# Patient Record
Sex: Female | Born: 1950
Health system: Southern US, Community
[De-identification: ages and names within clinical notes are randomized; demographics above are authoritative.]

---

## 2007-10-05 ENCOUNTER — Emergency Department: Payer: Self-pay | Admitting: Internal Medicine

## 2018-09-12 DIAGNOSIS — H5203 Hypermetropia, bilateral: Secondary | ICD-10-CM | POA: Diagnosis not present

## 2018-09-12 DIAGNOSIS — H524 Presbyopia: Secondary | ICD-10-CM | POA: Diagnosis not present

## 2018-11-07 DIAGNOSIS — H2513 Age-related nuclear cataract, bilateral: Secondary | ICD-10-CM | POA: Diagnosis not present

## 2018-11-13 DIAGNOSIS — I10 Essential (primary) hypertension: Secondary | ICD-10-CM | POA: Diagnosis not present

## 2018-11-13 DIAGNOSIS — R011 Cardiac murmur, unspecified: Secondary | ICD-10-CM | POA: Diagnosis not present

## 2018-11-13 DIAGNOSIS — R9431 Abnormal electrocardiogram [ECG] [EKG]: Secondary | ICD-10-CM | POA: Diagnosis not present

## 2018-11-13 DIAGNOSIS — H269 Unspecified cataract: Secondary | ICD-10-CM | POA: Diagnosis not present

## 2018-11-13 DIAGNOSIS — I208 Other forms of angina pectoris: Secondary | ICD-10-CM | POA: Diagnosis not present

## 2018-11-21 DIAGNOSIS — I10 Essential (primary) hypertension: Secondary | ICD-10-CM | POA: Diagnosis not present

## 2018-11-21 DIAGNOSIS — R9431 Abnormal electrocardiogram [ECG] [EKG]: Secondary | ICD-10-CM | POA: Diagnosis not present

## 2018-11-21 DIAGNOSIS — I208 Other forms of angina pectoris: Secondary | ICD-10-CM | POA: Diagnosis not present

## 2019-04-25 DIAGNOSIS — H2513 Age-related nuclear cataract, bilateral: Secondary | ICD-10-CM | POA: Diagnosis not present

## 2019-05-17 DIAGNOSIS — H2511 Age-related nuclear cataract, right eye: Secondary | ICD-10-CM | POA: Diagnosis not present

## 2019-06-10 DIAGNOSIS — H2512 Age-related nuclear cataract, left eye: Secondary | ICD-10-CM | POA: Diagnosis not present

## 2019-06-14 DIAGNOSIS — H25812 Combined forms of age-related cataract, left eye: Secondary | ICD-10-CM | POA: Diagnosis not present

## 2019-06-14 DIAGNOSIS — H268 Other specified cataract: Secondary | ICD-10-CM | POA: Diagnosis not present

## 2019-07-08 ENCOUNTER — Emergency Department
Admission: EM | Admit: 2019-07-08 | Discharge: 2019-07-08 | Disposition: A | Discharge status: Home / Self Care | Payer: Medicare HMO | Attending: Emergency Medicine | Admitting: Emergency Medicine

## 2019-07-08 ENCOUNTER — Other Ambulatory Visit: Payer: Self-pay

## 2019-07-08 ENCOUNTER — Emergency Department: Payer: Medicare HMO

## 2019-07-08 DIAGNOSIS — I10 Essential (primary) hypertension: Secondary | ICD-10-CM

## 2019-07-08 DIAGNOSIS — R42 Dizziness and giddiness: Secondary | ICD-10-CM

## 2019-07-08 LAB — URINALYSIS, COMPLETE (UACMP) WITH MICROSCOPIC
Bilirubin Urine: NEGATIVE
Glucose, UA: NEGATIVE mg/dL
Hgb urine dipstick: NEGATIVE
Ketones, ur: NEGATIVE mg/dL
Leukocytes,Ua: NEGATIVE
Nitrite: POSITIVE — AB
Protein, ur: NEGATIVE mg/dL
Specific Gravity, Urine: 1.006 (ref 1.005–1.030)
pH: 5 (ref 5.0–8.0)

## 2019-07-08 LAB — BASIC METABOLIC PANEL
Anion gap: 11 (ref 5–15)
BUN: 10 mg/dL (ref 8–23)
CO2: 22 mmol/L (ref 22–32)
Calcium: 9.7 mg/dL (ref 8.9–10.3)
Chloride: 107 mmol/L (ref 98–111)
Creatinine, Ser: 0.67 mg/dL (ref 0.44–1.00)
GFR calc Af Amer: >60 mL/min (ref >60)
GFR calc non Af Amer: >60 mL/min (ref >60)
Glucose, Bld: 103 mg/dL — ABNORMAL HIGH (ref 70–99)
Potassium: 3.4 mmol/L — ABNORMAL LOW (ref 3.5–5.1)
Sodium: 140 mmol/L (ref 135–145)

## 2019-07-08 LAB — CBC
HCT: 38.5 % (ref 36.0–46.0)
Hemoglobin: 12.8 g/dL (ref 12.0–15.0)
MCH: 29.7 pg (ref 26.0–34.0)
MCHC: 33.2 g/dL (ref 30.0–36.0)
MCV: 89.3 fL (ref 80.0–100.0)
Platelets: 311 10*3/uL (ref 150–400)
RBC: 4.31 MIL/uL (ref 3.87–5.11)
RDW: 12.9 % (ref 11.5–15.5)
WBC: 8.2 10*3/uL (ref 4.0–10.5)
nRBC: 0 % (ref 0.0–0.2)

## 2019-07-08 LAB — TROPONIN I (HIGH SENSITIVITY)
Troponin I (High Sensitivity): 9 ng/L (ref <18)
Troponin I (High Sensitivity): 14 ng/L (ref <18)

## 2019-07-08 MED ORDER — LOSARTAN POTASSIUM 50 MG PO TABS: 50.0000 mg | ORAL_TABLET | Freq: Every day | ORAL | 11 refills | Status: AC

## 2019-07-08 MED ORDER — HYDROCHLOROTHIAZIDE 12.5 MG PO TABS: 12.5000 mg | ORAL_TABLET | Freq: Every day | ORAL | 11 refills | Status: AC

## 2019-07-08 NOTE — ED Notes (Signed)
Ambulated to BR independently.  Gait steady.  Tolerated well. 

## 2019-07-08 NOTE — ED Notes (Signed)
Informed Mac RN pt placed in [facility]

## 2019-07-08 NOTE — ED Notes (Signed)
First Nurse Note: Pt to ED via POV c/o left side weakness x 2 weeks, has gotten progressively worse. Pt states that yesterday she felt "uncomfortable". Pt reports feeling dizzy. Dizziness has gotten worse as the morning has went on. Pt states that she went to bed around 4 pm yesterday, woke up at 0125 pt states that the dizziness had gotten progressively worse since then. No slurred speech noted. Pt was able to ambulate into ED without difficulty.

## 2019-07-08 NOTE — ED Triage Notes (Addendum)
Pt reports dizziness intermittently throughout the week, states this am at 0125 she woke up with severe dizziness and discomfort in her back and left shoulder and side. Pt reports that she drove herself here, states that she feels like she is leaning to the right side, pt appears to be sitting straight. Pt has seen dr Clayborn Bigness but doesn't recall the visit and is unsure is she was supposed to be on blood pressure meds, and reports that she didn't follow up. Pt's facial movements are symmetrical, no deviation of tongue with protrusion, pt has equal hand grips

## 2019-07-08 NOTE — ED Provider Notes (Signed)
[facility] Emergency Department Provider Note       Time seen: ----------------------------------------- 3:32 PM on 07/08/2019 -----------------------------------------   I have reviewed the triage vital signs and the nursing notes.  HISTORY   Chief Complaint Dizziness and Hypertension   HPI Beth Cruz is a 68 y.o. female with no known past medical history who presents to the ED for dizziness intermittently throughout the week.  Patient states this morning she woke up with severe dizziness and discomfort.  She drove herself here, felt like she was having head spinning.  She describes a visit earlier this year where she was prescribed blood pressure medication but has not been taking it.  No past medical history on file.  There are no active problems to display for this patient.  Allergies Penicillins  Social History Social History   Tobacco Use  . Smoking status: Not on file  Substance Use Topics  . Alcohol use: Not on file  . Drug use: Not on file   Review of Systems Constitutional: Negative for fever. Cardiovascular: Negative for chest pain. Respiratory: Negative for shortness of breath. Gastrointestinal: Negative for abdominal pain, vomiting and diarrhea. Musculoskeletal: Negative for back pain. Skin: Negative for rash. Neurological: Negative for headaches, focal weakness or numbness.  Positive for dizziness  All systems negative/normal/unremarkable except as stated in the HPI  ____________________________________________   PHYSICAL EXAM:  VITAL SIGNS: ED Triage Vitals  Enc Vitals Group     BP 07/08/19 1147 (!) 237/74     Pulse Rate 07/08/19 1147 (!) 58     Resp 07/08/19 1147 16     Temp 07/08/19 1147 99 F (37.2 C)     Temp Source 07/08/19 1147 Oral     SpO2 07/08/19 1147 100 %     Weight 07/08/19 1202 145 lb (65.8 kg)     Height 07/08/19 1202 5\' 6"  (1.676 m)     Head Circumference --      Peak Flow --      Pain  Score 07/08/19 1201 9     Pain Loc --      Pain Edu? --      Excl. in GC? --     Constitutional: Alert and oriented. Well appearing and in no distress. Eyes: Conjunctivae are normal. Normal extraocular movements. ENT      Head: Normocephalic and atraumatic.      Nose: No congestion/rhinnorhea.      Mouth/Throat: Mucous membranes are moist.      Neck: No stridor. Cardiovascular: Normal rate, regular rhythm. No murmurs, rubs, or gallops. Respiratory: Normal respiratory effort without tachypnea nor retractions. Breath sounds are clear and equal bilaterally. No wheezes/rales/rhonchi. Gastrointestinal: Soft and nontender. Normal bowel sounds Musculoskeletal: Nontender with normal range of motion in extremities. No lower extremity tenderness nor edema. Neurologic:  Normal speech and language. No gross focal neurologic deficits are appreciated.  Skin:  Skin is warm, dry and intact. No rash noted. Psychiatric: Mood and affect are normal. Speech and behavior are normal.  ____________________________________________  EKG: Interpreted by me.  Sinus bradycardia with a rate of 49 bpm, T wave abnormalities in the inferior and anterior lateral leads.  Normal QT  ____________________________________________  ED COURSE:  As part of my medical decision making, I reviewed the following data within the electronic MEDICAL RECORD NUMBER History obtained from family if available, nursing notes, old chart and ekg, as well as notes from prior ED visits. Patient presented for dizziness, we will assess with labs and  imaging as indicated at this time.   Procedures  Beth Cruz was evaluated in Emergency Department on 07/08/2019 for the symptoms described in the history of present illness. She was evaluated in the context of the global COVID-19 pandemic, which necessitated consideration that the patient might be at risk for infection with the SARS-CoV-2 virus that causes COVID-19. Institutional protocols and  algorithms that pertain to the evaluation of patients at risk for COVID-19 are in a state of rapid change based on information released by regulatory bodies including the CDC and federal and state organizations. These policies and algorithms were followed during the patient's care in the ED.  ____________________________________________   LABS (pertinent positives/negatives)  Labs Reviewed  BASIC METABOLIC PANEL - Abnormal; Notable for the following components:      Result Value   Potassium 3.4 (*)    Glucose, Bld 103 (*)    All other components within normal limits  URINALYSIS, COMPLETE (UACMP) WITH MICROSCOPIC - Abnormal; Notable for the following components:   Color, Urine STRAW (*)    APPearance CLEAR (*)    Nitrite POSITIVE (*)    Bacteria, UA MANY (*)    All other components within normal limits  URINE CULTURE  CBC  CBG MONITORING, ED  TROPONIN I (HIGH SENSITIVITY)  TROPONIN I (HIGH SENSITIVITY)    RADIOLOGY Images were viewed by me  CT head Is unremarkable ____________________________________________   DIFFERENTIAL DIAGNOSIS   Dizziness, dehydration, electrolyte abnormality, anxiety, vertigo  FINAL ASSESSMENT AND PLAN  Dizziness, hypertension   Plan: The patient had presented for dizziness. Patient's labs did reveal possible UTI for which we sent a urine culture. Patient's imaging was unremarkable.  Repeat troponin testing was negative.  I did review her charts from earlier this year and she was supposed to be taking Cozaar, hydrochlorothiazide and amlodipine.  She is somewhat bradycardic which will need to be rechecked.  I will hold amlodipine and just restart Cozaar and hydrochlorothiazide.   Laurence Aly, MD    Note: This note was generated in part or whole with voice recognition software. Voice recognition is usually quite accurate but there are transcription errors that can and very often do occur. I apologize for any typographical errors that were  not detected and corrected.     Earleen Newport, MD 07/08/19 [phone]

## 2019-07-09 LAB — URINE CULTURE

## 2019-07-12 DIAGNOSIS — I10 Essential (primary) hypertension: Secondary | ICD-10-CM | POA: Diagnosis not present

## 2019-07-12 DIAGNOSIS — R9431 Abnormal electrocardiogram [ECG] [EKG]: Secondary | ICD-10-CM | POA: Diagnosis not present

## 2019-07-12 DIAGNOSIS — R011 Cardiac murmur, unspecified: Secondary | ICD-10-CM | POA: Diagnosis not present

## 2019-07-12 DIAGNOSIS — H269 Unspecified cataract: Secondary | ICD-10-CM | POA: Diagnosis not present

## 2019-07-12 DIAGNOSIS — I208 Other forms of angina pectoris: Secondary | ICD-10-CM | POA: Diagnosis not present

## 2019-08-23 DIAGNOSIS — H524 Presbyopia: Secondary | ICD-10-CM | POA: Diagnosis not present

## 2019-08-23 DIAGNOSIS — H52209 Unspecified astigmatism, unspecified eye: Secondary | ICD-10-CM | POA: Diagnosis not present

## 2019-08-23 DIAGNOSIS — H5213 Myopia, bilateral: Secondary | ICD-10-CM | POA: Diagnosis not present

## 2019-08-23 DIAGNOSIS — Z20828 Contact with and (suspected) exposure to other viral communicable diseases: Secondary | ICD-10-CM | POA: Diagnosis not present

## 2019-11-08 ENCOUNTER — Ambulatory Visit: Payer: Medicare HMO | Attending: Internal Medicine

## 2019-11-08 DIAGNOSIS — Z23 Encounter for immunization: Secondary | ICD-10-CM

## 2019-11-08 NOTE — Progress Notes (Signed)
   Covid-19 Vaccination Clinic  Name:  Beth Cruz    MRN: [id number] DOB: 1950/09/26  11/08/2019  Beth Cruz was observed post Covid-19 immunization for 15 minutes based on pre-vaccination questions without incidence. She was provided with Vaccine Information Sheet and instruction to access the V-Safe system.   Beth Cruz was instructed to call 911 with any severe reactions post vaccine: Marland Kitchen Difficulty breathing  . Swelling of your face and throat  . A fast heartbeat  . A bad rash all over your body  . Dizziness and weakness    Immunizations Administered    Name Date Dose VIS Date Route   Pfizer COVID-19 Vaccine 11/08/2019 11:16 AM 0.3 mL 08/24/2019 Intramuscular   Manufacturer: [employer], [employer]   Lot: [id number]   NDC: [address]-1

## 2019-11-28 ENCOUNTER — Ambulatory Visit: Payer: Medicare HMO | Attending: Internal Medicine

## 2019-11-28 DIAGNOSIS — Z23 Encounter for immunization: Secondary | ICD-10-CM

## 2019-11-28 NOTE — Progress Notes (Signed)
   Covid-19 Vaccination Clinic  Name:  Beth Cruz    MRN: [id number] DOB: 01/26/1951  11/28/2019  [name] was observed post Covid-19 immunization for 15 minutes without incident. She was provided with Vaccine Information Sheet and instruction to access the V-Safe system.   [name] was instructed to call 911 with any severe reactions post vaccine: Marland Kitchen Difficulty breathing  . Swelling of face and throat  . A fast heartbeat  . A bad rash all over body  . Dizziness and weakness   Immunizations Administered    Name Date Dose VIS Date Route   Pfizer COVID-19 Vaccine 11/28/2019 12:51 PM 0.3 mL 08/24/2019 Intramuscular   Manufacturer: [employer], [employer]   Lot: [id number]   NDC: [address]-5

## 2020-01-28 DIAGNOSIS — Z0100 Encounter for examination of eyes and vision without abnormal findings: Secondary | ICD-10-CM | POA: Diagnosis not present

## 2020-07-10 DIAGNOSIS — I10 Essential (primary) hypertension: Secondary | ICD-10-CM | POA: Diagnosis not present

## 2020-07-10 DIAGNOSIS — I208 Other forms of angina pectoris: Secondary | ICD-10-CM | POA: Diagnosis not present

## 2020-07-10 DIAGNOSIS — R42 Dizziness and giddiness: Secondary | ICD-10-CM | POA: Diagnosis not present

## 2020-07-10 DIAGNOSIS — Z720 Tobacco use: Secondary | ICD-10-CM | POA: Diagnosis not present

## 2020-07-10 DIAGNOSIS — R0600 Dyspnea, unspecified: Secondary | ICD-10-CM | POA: Diagnosis not present

## 2020-07-10 DIAGNOSIS — R011 Cardiac murmur, unspecified: Secondary | ICD-10-CM | POA: Diagnosis not present

## 2020-08-18 DIAGNOSIS — Z880 Allergy status to penicillin: Secondary | ICD-10-CM | POA: Diagnosis not present

## 2020-08-18 DIAGNOSIS — I10 Essential (primary) hypertension: Secondary | ICD-10-CM | POA: Diagnosis not present

## 2020-08-18 DIAGNOSIS — Z87891 Personal history of nicotine dependence: Secondary | ICD-10-CM | POA: Diagnosis not present

## 2020-08-18 DIAGNOSIS — Z809 Family history of malignant neoplasm, unspecified: Secondary | ICD-10-CM | POA: Diagnosis not present

## 2020-08-21 DIAGNOSIS — R69 Illness, unspecified: Secondary | ICD-10-CM | POA: Diagnosis not present

## 2020-08-29 DIAGNOSIS — R69 Illness, unspecified: Secondary | ICD-10-CM | POA: Diagnosis not present

## 2020-10-29 IMAGING — CT CT HEAD W/O CM
3 series · 15 of 47 positions shown, 18 images · non-contrast
Comparison: None.

CLINICAL DATA: Vertigo

EXAM:
CT HEAD WITHOUT CONTRAST
TECHNIQUE: Contiguous axial images were obtained from the base of the skull
through the vertex without intravenous contrast.

[Series 2: head wo · axial · 0.47mm/px · z∈[-133,-8]mm · 9 of 30 slices shown, 12 images]
[im 3/30  brain]
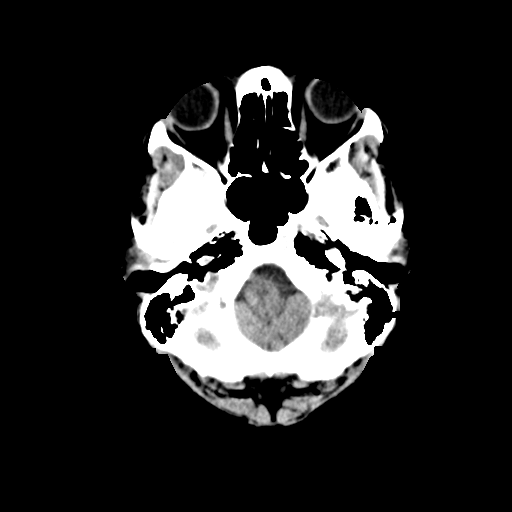
[im 3/30  bone]
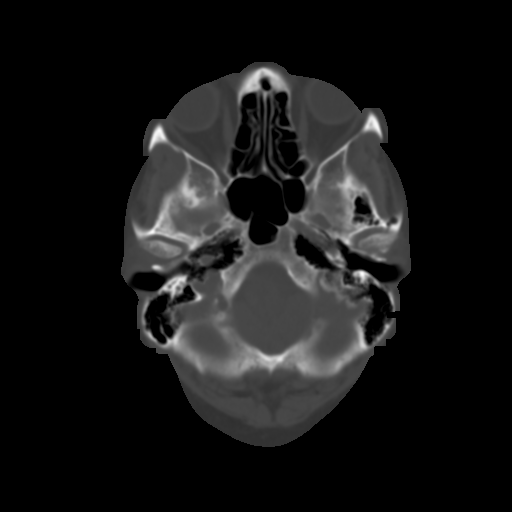
[im 6/30  brain]
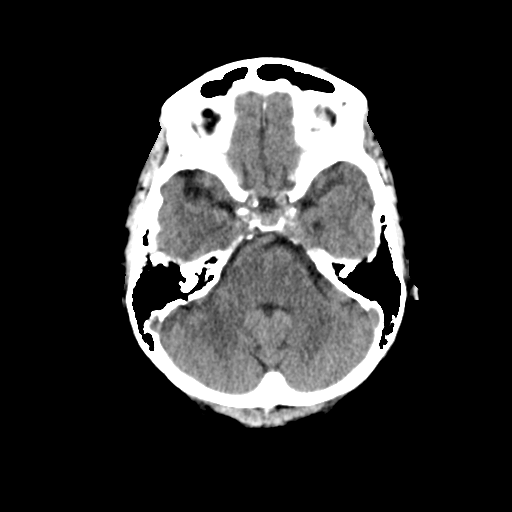
[im 9/30  brain]
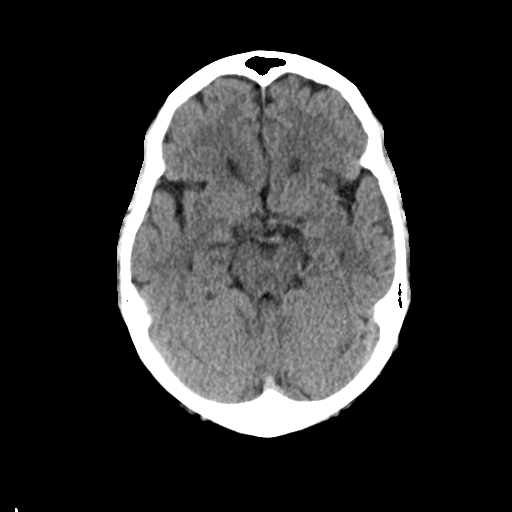
[im 12/30  brain]
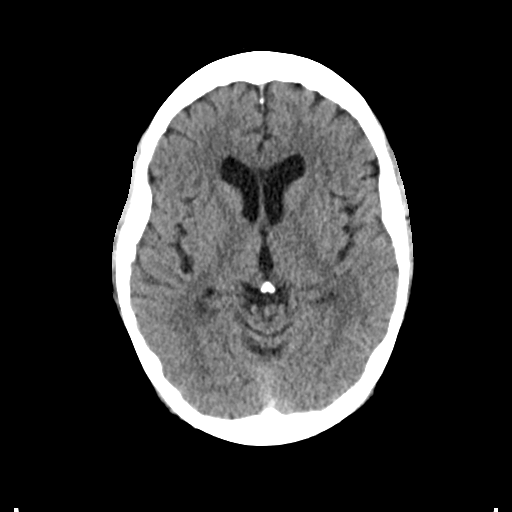
[im 16/30  brain]
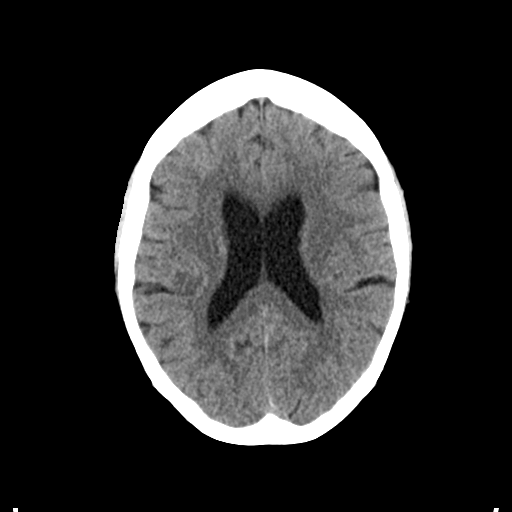
[im 16/30  bone]
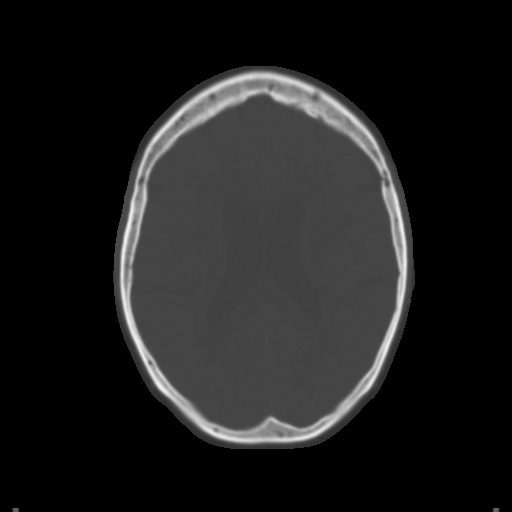
[im 19/30  brain]
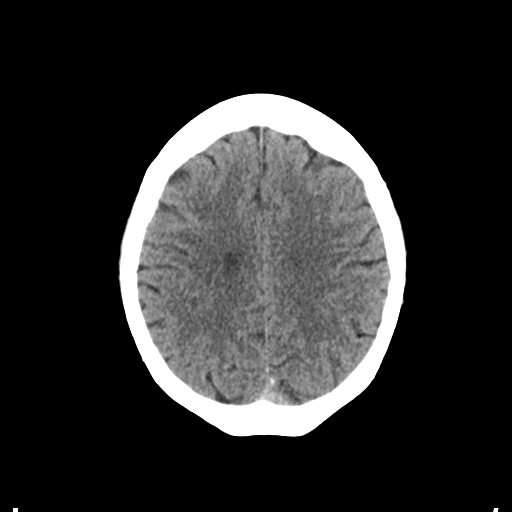
[im 22/30  brain]
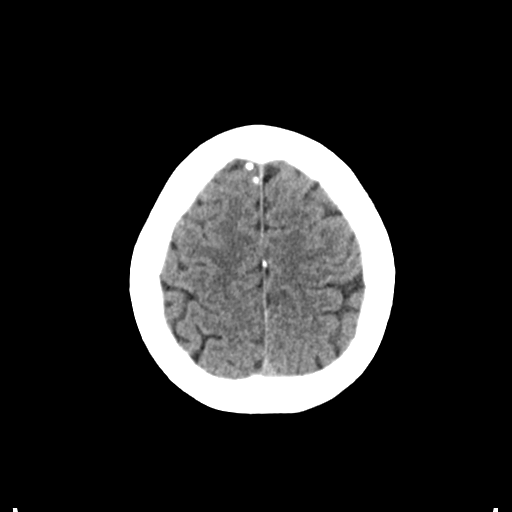
[im 25/30  brain]
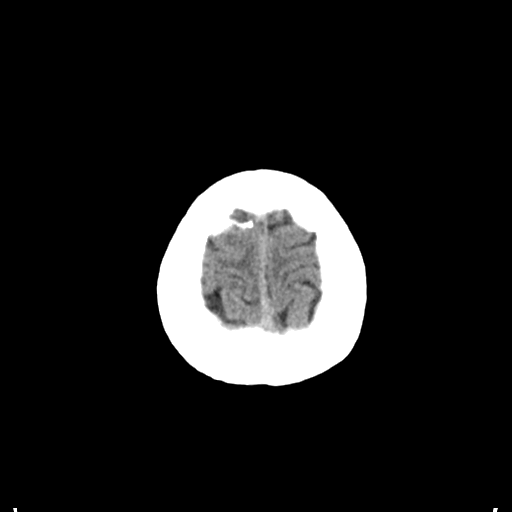
[im 28/30  brain]
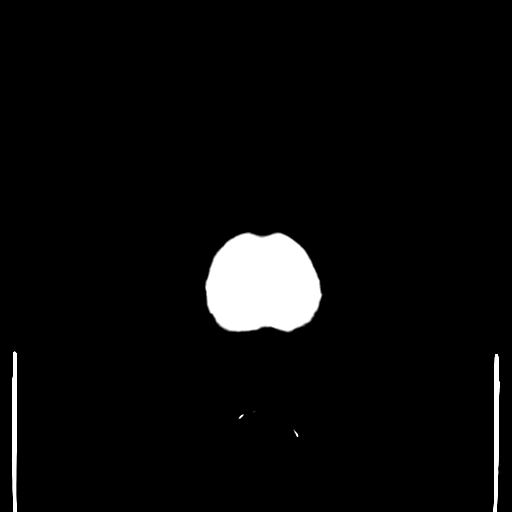
[im 28/30  bone]
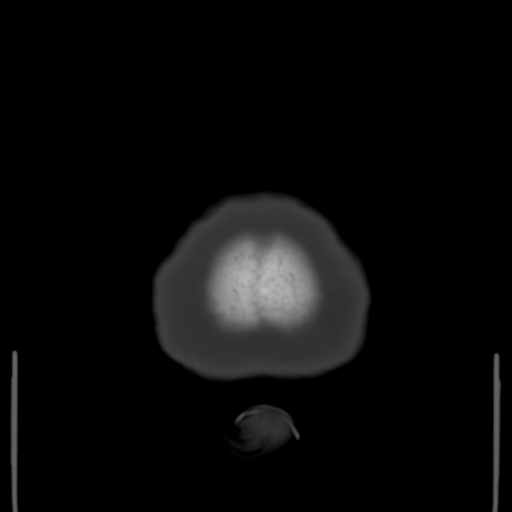

[Series 4: coronal soft tissue · coronal · 0.28mm/px · 3 of 66 slices shown]
[im 22/66  brain]
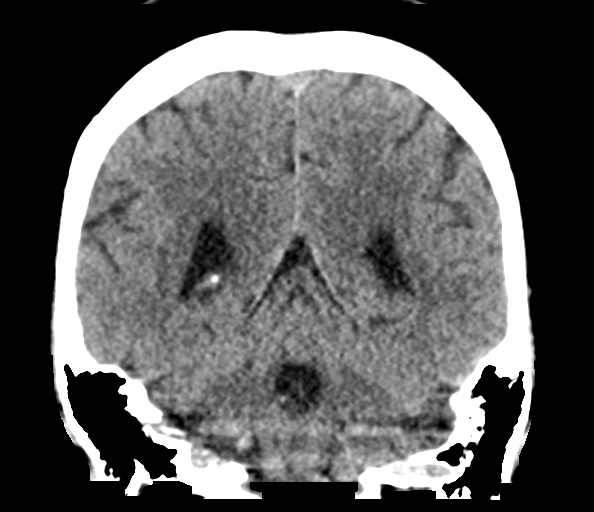
[im 29/66  brain]
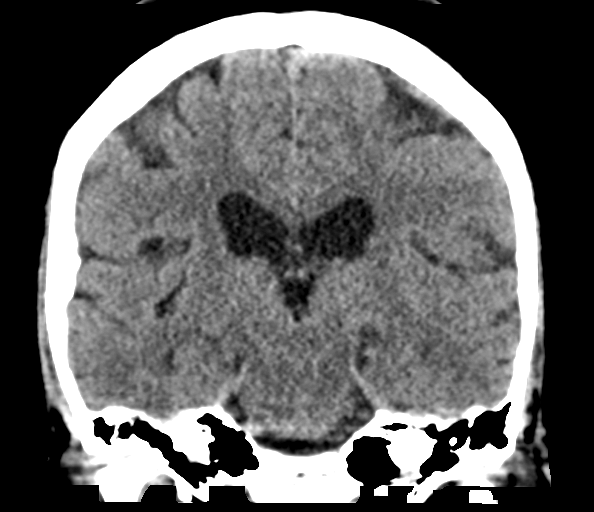
[im 37/66  brain]
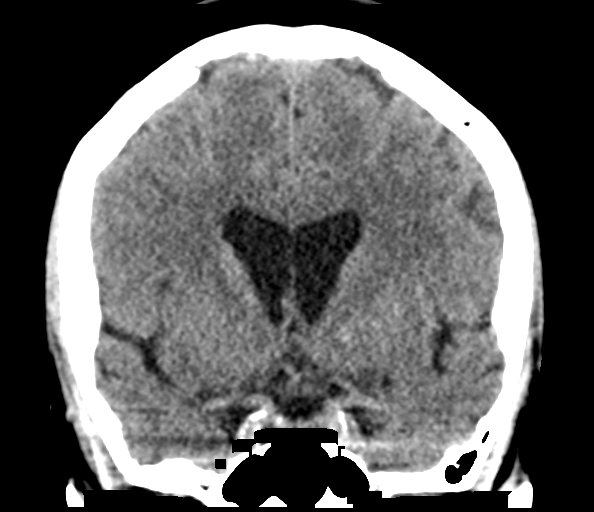

[Series 5: sagittal soft tissue · sagittal · 0.28mm/px · 3 of 56 slices shown]
[im 19/56  brain]
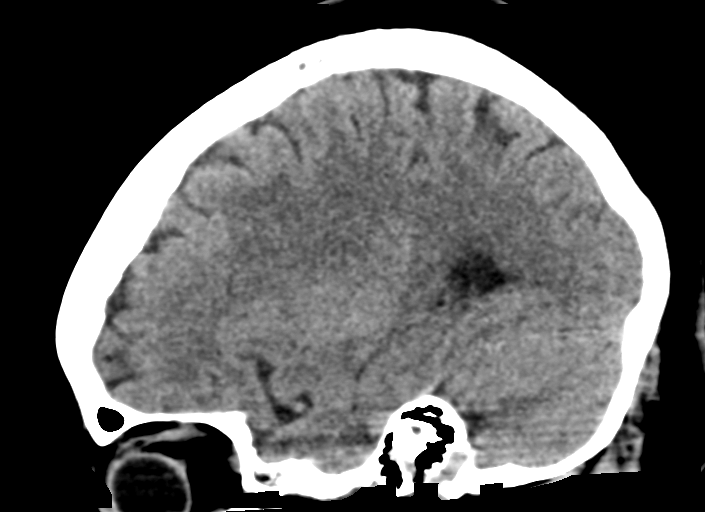
[im 28/56  brain]
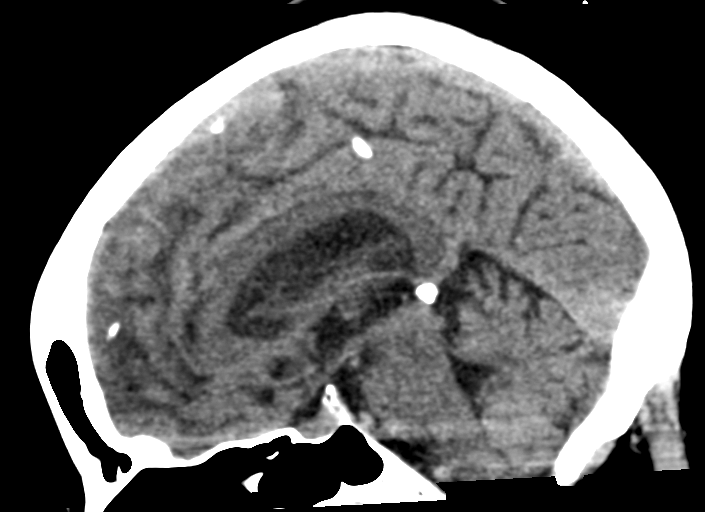
[im 37/56  brain]
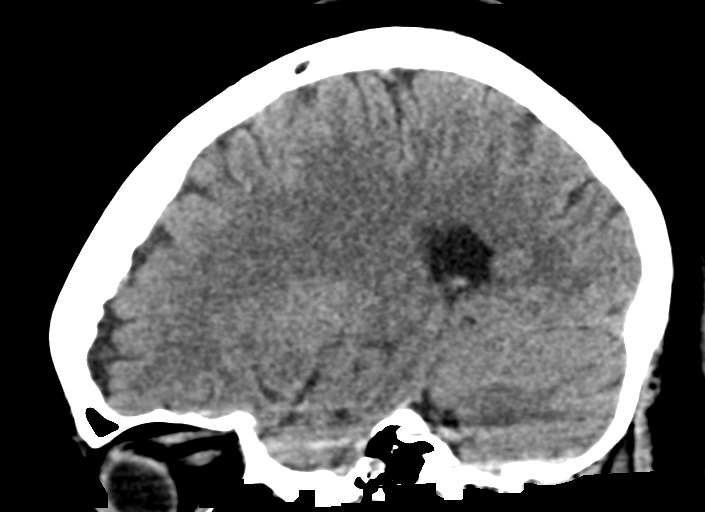

[15 of 47 positions shown; findings below may reference images not displayed]

FINDINGS: Brain: No evidence of acute infarction, hemorrhage, hydrocephalus,
extra-axial collection or mass lesion/mass effect.

Vascular: There are vascular calcifications in the carotid siphons.

Skull: Normal. Negative for fracture or focal lesion.

Sinuses/Orbits: No acute finding.

Other: None.
IMPRESSION: No acute intracranial process.

## 2021-01-07 DIAGNOSIS — Z7689 Persons encountering health services in other specified circumstances: Secondary | ICD-10-CM | POA: Diagnosis not present

## 2021-01-07 DIAGNOSIS — I208 Other forms of angina pectoris: Secondary | ICD-10-CM | POA: Diagnosis not present

## 2021-01-07 DIAGNOSIS — R0600 Dyspnea, unspecified: Secondary | ICD-10-CM | POA: Diagnosis not present

## 2021-01-07 DIAGNOSIS — Z79899 Other long term (current) drug therapy: Secondary | ICD-10-CM | POA: Diagnosis not present

## 2021-01-07 DIAGNOSIS — R69 Illness, unspecified: Secondary | ICD-10-CM | POA: Diagnosis not present

## 2021-01-07 DIAGNOSIS — R9431 Abnormal electrocardiogram [ECG] [EKG]: Secondary | ICD-10-CM | POA: Diagnosis not present

## 2021-01-07 DIAGNOSIS — I10 Essential (primary) hypertension: Secondary | ICD-10-CM | POA: Diagnosis not present

## 2021-01-07 DIAGNOSIS — R42 Dizziness and giddiness: Secondary | ICD-10-CM | POA: Diagnosis not present

## 2021-01-07 DIAGNOSIS — R011 Cardiac murmur, unspecified: Secondary | ICD-10-CM | POA: Diagnosis not present

## 2021-01-20 DIAGNOSIS — R0600 Dyspnea, unspecified: Secondary | ICD-10-CM | POA: Diagnosis not present

## 2021-01-20 DIAGNOSIS — I208 Other forms of angina pectoris: Secondary | ICD-10-CM | POA: Diagnosis not present

## 2021-02-03 DIAGNOSIS — M25472 Effusion, left ankle: Secondary | ICD-10-CM | POA: Diagnosis not present

## 2021-02-03 DIAGNOSIS — R9431 Abnormal electrocardiogram [ECG] [EKG]: Secondary | ICD-10-CM | POA: Diagnosis not present

## 2021-02-03 DIAGNOSIS — M25471 Effusion, right ankle: Secondary | ICD-10-CM | POA: Diagnosis not present

## 2021-02-03 DIAGNOSIS — I208 Other forms of angina pectoris: Secondary | ICD-10-CM | POA: Diagnosis not present

## 2021-02-03 DIAGNOSIS — R42 Dizziness and giddiness: Secondary | ICD-10-CM | POA: Diagnosis not present

## 2021-02-03 DIAGNOSIS — R011 Cardiac murmur, unspecified: Secondary | ICD-10-CM | POA: Diagnosis not present

## 2021-02-03 DIAGNOSIS — R0600 Dyspnea, unspecified: Secondary | ICD-10-CM | POA: Diagnosis not present

## 2021-02-03 DIAGNOSIS — I10 Essential (primary) hypertension: Secondary | ICD-10-CM | POA: Diagnosis not present

## 2021-02-03 DIAGNOSIS — Z720 Tobacco use: Secondary | ICD-10-CM | POA: Diagnosis not present

## 2021-03-27 ENCOUNTER — Other Ambulatory Visit: Payer: Self-pay | Admitting: Infectious Diseases

## 2021-03-27 DIAGNOSIS — Z1211 Encounter for screening for malignant neoplasm of colon: Secondary | ICD-10-CM | POA: Diagnosis not present

## 2021-03-27 DIAGNOSIS — Z1212 Encounter for screening for malignant neoplasm of rectum: Secondary | ICD-10-CM | POA: Diagnosis not present

## 2021-03-27 DIAGNOSIS — R69 Illness, unspecified: Secondary | ICD-10-CM | POA: Diagnosis not present

## 2021-03-27 DIAGNOSIS — Z0000 Encounter for general adult medical examination without abnormal findings: Secondary | ICD-10-CM | POA: Diagnosis not present

## 2021-03-27 DIAGNOSIS — Z1231 Encounter for screening mammogram for malignant neoplasm of breast: Secondary | ICD-10-CM

## 2021-03-27 DIAGNOSIS — I10 Essential (primary) hypertension: Secondary | ICD-10-CM | POA: Diagnosis not present

## 2021-04-07 DIAGNOSIS — Z1211 Encounter for screening for malignant neoplasm of colon: Secondary | ICD-10-CM | POA: Diagnosis not present

## 2021-04-07 DIAGNOSIS — Z1212 Encounter for screening for malignant neoplasm of rectum: Secondary | ICD-10-CM | POA: Diagnosis not present

## 2021-04-13 LAB — COLOGUARD: COLOGUARD: NEGATIVE

## 2021-05-04 DIAGNOSIS — R69 Illness, unspecified: Secondary | ICD-10-CM | POA: Diagnosis not present

## 2021-06-02 ENCOUNTER — Ambulatory Visit
Admission: RE | Admit: 2021-06-02 | Discharge: 2021-06-02 | Disposition: A | Discharge status: Home / Self Care | Payer: Medicare HMO | Source: Ambulatory Visit | Attending: Infectious Diseases | Admitting: Infectious Diseases

## 2021-06-02 ENCOUNTER — Other Ambulatory Visit: Payer: Self-pay

## 2021-06-02 DIAGNOSIS — Z1231 Encounter for screening mammogram for malignant neoplasm of breast: Secondary | ICD-10-CM | POA: Insufficient documentation

## 2021-06-03 ENCOUNTER — Other Ambulatory Visit: Payer: Self-pay | Admitting: *Deleted

## 2021-06-03 ENCOUNTER — Inpatient Hospital Stay
Admission: RE | Admit: 2021-06-03 | Discharge: 2021-06-03 | Disposition: A | Discharge status: Home / Self Care | Payer: Self-pay | Source: Ambulatory Visit | Attending: *Deleted | Admitting: *Deleted

## 2021-06-03 DIAGNOSIS — Z1231 Encounter for screening mammogram for malignant neoplasm of breast: Secondary | ICD-10-CM

## 2021-06-29 DIAGNOSIS — R69 Illness, unspecified: Secondary | ICD-10-CM | POA: Diagnosis not present

## 2021-06-29 DIAGNOSIS — I208 Other forms of angina pectoris: Secondary | ICD-10-CM | POA: Diagnosis not present

## 2021-06-29 DIAGNOSIS — Z720 Tobacco use: Secondary | ICD-10-CM | POA: Diagnosis not present

## 2021-06-29 DIAGNOSIS — R0609 Other forms of dyspnea: Secondary | ICD-10-CM | POA: Diagnosis not present

## 2021-06-29 DIAGNOSIS — R42 Dizziness and giddiness: Secondary | ICD-10-CM | POA: Diagnosis not present

## 2021-06-29 DIAGNOSIS — R011 Cardiac murmur, unspecified: Secondary | ICD-10-CM | POA: Diagnosis not present

## 2021-06-29 DIAGNOSIS — R9431 Abnormal electrocardiogram [ECG] [EKG]: Secondary | ICD-10-CM | POA: Diagnosis not present

## 2021-06-29 DIAGNOSIS — I10 Essential (primary) hypertension: Secondary | ICD-10-CM | POA: Diagnosis not present

## 2021-11-09 DIAGNOSIS — R69 Illness, unspecified: Secondary | ICD-10-CM | POA: Diagnosis not present

## 2021-11-09 DIAGNOSIS — Z13220 Encounter for screening for lipoid disorders: Secondary | ICD-10-CM | POA: Diagnosis not present

## 2021-11-09 DIAGNOSIS — Z0000 Encounter for general adult medical examination without abnormal findings: Secondary | ICD-10-CM | POA: Diagnosis not present

## 2021-11-09 DIAGNOSIS — Z780 Asymptomatic menopausal state: Secondary | ICD-10-CM | POA: Diagnosis not present

## 2021-11-09 DIAGNOSIS — R778 Other specified abnormalities of plasma proteins: Secondary | ICD-10-CM | POA: Diagnosis not present

## 2021-11-09 DIAGNOSIS — F17210 Nicotine dependence, cigarettes, uncomplicated: Secondary | ICD-10-CM | POA: Diagnosis not present

## 2021-11-09 DIAGNOSIS — Z720 Tobacco use: Secondary | ICD-10-CM | POA: Diagnosis not present

## 2021-11-09 DIAGNOSIS — I10 Essential (primary) hypertension: Secondary | ICD-10-CM | POA: Diagnosis not present

## 2021-11-09 DIAGNOSIS — E7800 Pure hypercholesterolemia, unspecified: Secondary | ICD-10-CM | POA: Diagnosis not present

## 2021-11-18 DIAGNOSIS — H0102A Squamous blepharitis right eye, upper and lower eyelids: Secondary | ICD-10-CM | POA: Diagnosis not present

## 2021-11-18 DIAGNOSIS — Z961 Presence of intraocular lens: Secondary | ICD-10-CM | POA: Diagnosis not present

## 2021-11-18 DIAGNOSIS — H0015 Chalazion left lower eyelid: Secondary | ICD-10-CM | POA: Diagnosis not present

## 2021-11-18 DIAGNOSIS — H0102B Squamous blepharitis left eye, upper and lower eyelids: Secondary | ICD-10-CM | POA: Diagnosis not present

## 2021-11-27 DIAGNOSIS — M810 Age-related osteoporosis without current pathological fracture: Secondary | ICD-10-CM | POA: Diagnosis not present

## 2021-12-28 DIAGNOSIS — E7800 Pure hypercholesterolemia, unspecified: Secondary | ICD-10-CM | POA: Diagnosis not present

## 2021-12-28 DIAGNOSIS — I10 Essential (primary) hypertension: Secondary | ICD-10-CM | POA: Diagnosis not present

## 2021-12-28 DIAGNOSIS — R69 Illness, unspecified: Secondary | ICD-10-CM | POA: Diagnosis not present

## 2021-12-28 DIAGNOSIS — D72829 Elevated white blood cell count, unspecified: Secondary | ICD-10-CM | POA: Diagnosis not present

## 2021-12-28 DIAGNOSIS — R42 Dizziness and giddiness: Secondary | ICD-10-CM | POA: Diagnosis not present

## 2021-12-28 DIAGNOSIS — Z114 Encounter for screening for human immunodeficiency virus [HIV]: Secondary | ICD-10-CM | POA: Diagnosis not present

## 2021-12-28 DIAGNOSIS — R778 Other specified abnormalities of plasma proteins: Secondary | ICD-10-CM | POA: Diagnosis not present

## 2021-12-28 DIAGNOSIS — I639 Cerebral infarction, unspecified: Secondary | ICD-10-CM | POA: Diagnosis not present

## 2021-12-28 DIAGNOSIS — R0989 Other specified symptoms and signs involving the circulatory and respiratory systems: Secondary | ICD-10-CM | POA: Diagnosis not present

## 2021-12-29 ENCOUNTER — Other Ambulatory Visit: Payer: Self-pay | Admitting: Internal Medicine

## 2021-12-29 DIAGNOSIS — R0989 Other specified symptoms and signs involving the circulatory and respiratory systems: Secondary | ICD-10-CM

## 2021-12-30 ENCOUNTER — Other Ambulatory Visit: Payer: Self-pay

## 2021-12-30 ENCOUNTER — Ambulatory Visit
Admission: RE | Admit: 2021-12-30 | Discharge: 2021-12-30 | Disposition: A | Discharge status: Home / Self Care | Payer: Medicare HMO | Source: Ambulatory Visit | Attending: Internal Medicine | Admitting: Internal Medicine

## 2021-12-30 DIAGNOSIS — R0989 Other specified symptoms and signs involving the circulatory and respiratory systems: Secondary | ICD-10-CM | POA: Diagnosis not present

## 2021-12-30 DIAGNOSIS — H539 Unspecified visual disturbance: Secondary | ICD-10-CM | POA: Insufficient documentation

## 2021-12-30 DIAGNOSIS — R519 Headache, unspecified: Secondary | ICD-10-CM | POA: Insufficient documentation

## 2022-01-26 ENCOUNTER — Other Ambulatory Visit: Payer: Self-pay | Admitting: Internal Medicine

## 2022-01-26 DIAGNOSIS — I208 Other forms of angina pectoris: Secondary | ICD-10-CM

## 2022-01-26 DIAGNOSIS — R42 Dizziness and giddiness: Secondary | ICD-10-CM | POA: Diagnosis not present

## 2022-01-26 DIAGNOSIS — R413 Other amnesia: Secondary | ICD-10-CM | POA: Diagnosis not present

## 2022-01-26 DIAGNOSIS — Z720 Tobacco use: Secondary | ICD-10-CM | POA: Diagnosis not present

## 2022-01-26 DIAGNOSIS — I10 Essential (primary) hypertension: Secondary | ICD-10-CM | POA: Diagnosis not present

## 2022-01-26 DIAGNOSIS — R29898 Other symptoms and signs involving the musculoskeletal system: Secondary | ICD-10-CM | POA: Diagnosis not present

## 2022-01-29 ENCOUNTER — Telehealth (HOSPITAL_COMMUNITY): Payer: Self-pay | Admitting: *Deleted

## 2022-01-29 NOTE — Telephone Encounter (Signed)
Reaching out to patient to offer assistance regarding upcoming cardiac imaging study; pt verbalizes understanding of appt date/time, parking situation and where to check in, pre-test NPO status  and verified current allergies; name and call back number provided for further questions should they arise ? ?Scottie Stanish RN Navigator Cardiac Imaging ?[facility] ?[phone] office ?[phone] cell ? ?

## 2022-02-01 ENCOUNTER — Ambulatory Visit
Admission: RE | Admit: 2022-02-01 | Discharge: 2022-02-01 | Disposition: A | Discharge status: Home / Self Care | Payer: Medicare HMO | Source: Ambulatory Visit | Attending: Internal Medicine | Admitting: Internal Medicine

## 2022-02-01 DIAGNOSIS — I208 Other forms of angina pectoris: Secondary | ICD-10-CM | POA: Diagnosis not present

## 2022-02-01 MED ORDER — NITROGLYCERIN 0.4 MG SL SUBL
0.8000 mg | SUBLINGUAL_TABLET | Freq: Once | SUBLINGUAL | Status: AC
  Administered 2022-02-01: 0.8 mg via SUBLINGUAL

## 2022-02-01 MED ORDER — METOPROLOL TARTRATE 5 MG/5ML IV SOLN
10.0000 mg | Freq: Once | INTRAVENOUS | Status: DC
Start: 2022-02-01 — End: 2022-02-02

## 2022-02-01 MED ORDER — IOHEXOL 350 MG/ML SOLN
75.0000 mL | Freq: Once | INTRAVENOUS | Status: AC | PRN
  Administered 2022-02-01: 75 mL via INTRAVENOUS

## 2022-02-01 NOTE — Progress Notes (Signed)
Patient tolerated procedure well. Ambulate w/o difficulty. Denies light headedness or being dizzy. Sitting in chair drinking water provided. Encouraged to drink extra water today and reasoning explained. Verbalized understanding. All questions answered. ABC intact. No further needs. Discharge from procedure area w/o issues.   °

## 2022-04-07 DIAGNOSIS — R4789 Other speech disturbances: Secondary | ICD-10-CM | POA: Diagnosis not present

## 2022-04-07 DIAGNOSIS — I10 Essential (primary) hypertension: Secondary | ICD-10-CM | POA: Diagnosis not present

## 2022-04-07 DIAGNOSIS — Z720 Tobacco use: Secondary | ICD-10-CM | POA: Diagnosis not present

## 2022-04-07 DIAGNOSIS — G939 Disorder of brain, unspecified: Secondary | ICD-10-CM | POA: Diagnosis not present

## 2022-04-07 DIAGNOSIS — R42 Dizziness and giddiness: Secondary | ICD-10-CM | POA: Diagnosis not present

## 2022-04-12 DIAGNOSIS — I10 Essential (primary) hypertension: Secondary | ICD-10-CM | POA: Diagnosis not present

## 2022-04-12 DIAGNOSIS — Z720 Tobacco use: Secondary | ICD-10-CM | POA: Diagnosis not present

## 2022-04-12 DIAGNOSIS — Z79899 Other long term (current) drug therapy: Secondary | ICD-10-CM | POA: Diagnosis not present

## 2022-04-12 DIAGNOSIS — R42 Dizziness and giddiness: Secondary | ICD-10-CM | POA: Diagnosis not present

## 2022-04-12 DIAGNOSIS — R29898 Other symptoms and signs involving the musculoskeletal system: Secondary | ICD-10-CM | POA: Diagnosis not present

## 2022-04-12 DIAGNOSIS — I2510 Atherosclerotic heart disease of native coronary artery without angina pectoris: Secondary | ICD-10-CM | POA: Diagnosis not present

## 2022-04-12 DIAGNOSIS — R911 Solitary pulmonary nodule: Secondary | ICD-10-CM | POA: Diagnosis not present

## 2022-04-12 DIAGNOSIS — R0789 Other chest pain: Secondary | ICD-10-CM | POA: Diagnosis not present

## 2022-04-12 DIAGNOSIS — R413 Other amnesia: Secondary | ICD-10-CM | POA: Diagnosis not present

## 2022-05-13 DIAGNOSIS — I10 Essential (primary) hypertension: Secondary | ICD-10-CM | POA: Diagnosis not present

## 2022-05-13 DIAGNOSIS — Z87891 Personal history of nicotine dependence: Secondary | ICD-10-CM | POA: Diagnosis not present

## 2022-05-13 DIAGNOSIS — R0789 Other chest pain: Secondary | ICD-10-CM | POA: Diagnosis not present

## 2022-05-13 DIAGNOSIS — R413 Other amnesia: Secondary | ICD-10-CM | POA: Diagnosis not present

## 2022-05-13 DIAGNOSIS — I2510 Atherosclerotic heart disease of native coronary artery without angina pectoris: Secondary | ICD-10-CM | POA: Diagnosis not present

## 2022-05-13 DIAGNOSIS — R42 Dizziness and giddiness: Secondary | ICD-10-CM | POA: Diagnosis not present

## 2022-07-09 DIAGNOSIS — H02885 Meibomian gland dysfunction left lower eyelid: Secondary | ICD-10-CM | POA: Diagnosis not present

## 2022-07-09 DIAGNOSIS — H0014 Chalazion left upper eyelid: Secondary | ICD-10-CM | POA: Diagnosis not present

## 2022-07-09 DIAGNOSIS — H02882 Meibomian gland dysfunction right lower eyelid: Secondary | ICD-10-CM | POA: Diagnosis not present

## 2022-07-09 DIAGNOSIS — H0102A Squamous blepharitis right eye, upper and lower eyelids: Secondary | ICD-10-CM | POA: Diagnosis not present

## 2022-07-09 DIAGNOSIS — H0102B Squamous blepharitis left eye, upper and lower eyelids: Secondary | ICD-10-CM | POA: Diagnosis not present

## 2022-07-09 DIAGNOSIS — Z961 Presence of intraocular lens: Secondary | ICD-10-CM | POA: Diagnosis not present

## 2022-08-02 DIAGNOSIS — H0102A Squamous blepharitis right eye, upper and lower eyelids: Secondary | ICD-10-CM | POA: Diagnosis not present

## 2022-08-02 DIAGNOSIS — H0014 Chalazion left upper eyelid: Secondary | ICD-10-CM | POA: Diagnosis not present

## 2022-08-02 DIAGNOSIS — H02882 Meibomian gland dysfunction right lower eyelid: Secondary | ICD-10-CM | POA: Diagnosis not present

## 2022-08-02 DIAGNOSIS — H02885 Meibomian gland dysfunction left lower eyelid: Secondary | ICD-10-CM | POA: Diagnosis not present

## 2022-08-02 DIAGNOSIS — Z961 Presence of intraocular lens: Secondary | ICD-10-CM | POA: Diagnosis not present

## 2022-08-02 DIAGNOSIS — H0102B Squamous blepharitis left eye, upper and lower eyelids: Secondary | ICD-10-CM | POA: Diagnosis not present

## 2022-09-02 DIAGNOSIS — R42 Dizziness and giddiness: Secondary | ICD-10-CM | POA: Diagnosis not present

## 2022-09-02 DIAGNOSIS — I10 Essential (primary) hypertension: Secondary | ICD-10-CM | POA: Diagnosis not present

## 2022-09-02 DIAGNOSIS — Z720 Tobacco use: Secondary | ICD-10-CM | POA: Diagnosis not present

## 2022-09-02 DIAGNOSIS — G939 Disorder of brain, unspecified: Secondary | ICD-10-CM | POA: Diagnosis not present

## 2022-10-28 DIAGNOSIS — Z23 Encounter for immunization: Secondary | ICD-10-CM | POA: Diagnosis not present

## 2022-11-10 DIAGNOSIS — Z720 Tobacco use: Secondary | ICD-10-CM | POA: Diagnosis not present

## 2022-11-10 DIAGNOSIS — Z780 Asymptomatic menopausal state: Secondary | ICD-10-CM | POA: Diagnosis not present

## 2022-11-10 DIAGNOSIS — I10 Essential (primary) hypertension: Secondary | ICD-10-CM | POA: Diagnosis not present

## 2022-11-10 DIAGNOSIS — R778 Other specified abnormalities of plasma proteins: Secondary | ICD-10-CM | POA: Diagnosis not present

## 2022-11-10 DIAGNOSIS — E785 Hyperlipidemia, unspecified: Secondary | ICD-10-CM | POA: Diagnosis not present

## 2022-11-10 DIAGNOSIS — E7800 Pure hypercholesterolemia, unspecified: Secondary | ICD-10-CM | POA: Diagnosis not present

## 2022-11-10 DIAGNOSIS — Z0000 Encounter for general adult medical examination without abnormal findings: Secondary | ICD-10-CM | POA: Diagnosis not present

## 2022-11-10 DIAGNOSIS — Z1231 Encounter for screening mammogram for malignant neoplasm of breast: Secondary | ICD-10-CM | POA: Diagnosis not present

## 2022-11-10 DIAGNOSIS — R69 Illness, unspecified: Secondary | ICD-10-CM | POA: Diagnosis not present

## 2022-11-11 ENCOUNTER — Other Ambulatory Visit: Payer: Self-pay | Admitting: Infectious Diseases

## 2022-11-11 DIAGNOSIS — I10 Essential (primary) hypertension: Secondary | ICD-10-CM | POA: Diagnosis not present

## 2022-11-11 DIAGNOSIS — I2510 Atherosclerotic heart disease of native coronary artery without angina pectoris: Secondary | ICD-10-CM | POA: Diagnosis not present

## 2022-11-11 DIAGNOSIS — E785 Hyperlipidemia, unspecified: Secondary | ICD-10-CM | POA: Diagnosis not present

## 2022-11-11 DIAGNOSIS — Z1231 Encounter for screening mammogram for malignant neoplasm of breast: Secondary | ICD-10-CM

## 2022-11-11 DIAGNOSIS — Z87891 Personal history of nicotine dependence: Secondary | ICD-10-CM | POA: Diagnosis not present

## 2022-11-11 DIAGNOSIS — R42 Dizziness and giddiness: Secondary | ICD-10-CM | POA: Diagnosis not present

## 2022-11-24 DIAGNOSIS — H02885 Meibomian gland dysfunction left lower eyelid: Secondary | ICD-10-CM | POA: Diagnosis not present

## 2022-11-24 DIAGNOSIS — H0102B Squamous blepharitis left eye, upper and lower eyelids: Secondary | ICD-10-CM | POA: Diagnosis not present

## 2022-11-24 DIAGNOSIS — H0015 Chalazion left lower eyelid: Secondary | ICD-10-CM | POA: Diagnosis not present

## 2022-11-24 DIAGNOSIS — Z961 Presence of intraocular lens: Secondary | ICD-10-CM | POA: Diagnosis not present

## 2022-11-24 DIAGNOSIS — H0102A Squamous blepharitis right eye, upper and lower eyelids: Secondary | ICD-10-CM | POA: Diagnosis not present

## 2022-11-24 DIAGNOSIS — H02882 Meibomian gland dysfunction right lower eyelid: Secondary | ICD-10-CM | POA: Diagnosis not present

## 2022-11-24 DIAGNOSIS — D492 Neoplasm of unspecified behavior of bone, soft tissue, and skin: Secondary | ICD-10-CM | POA: Diagnosis not present

## 2022-12-08 DIAGNOSIS — Z122 Encounter for screening for malignant neoplasm of respiratory organs: Secondary | ICD-10-CM | POA: Diagnosis not present

## 2022-12-08 DIAGNOSIS — D72829 Elevated white blood cell count, unspecified: Secondary | ICD-10-CM | POA: Diagnosis not present

## 2022-12-08 DIAGNOSIS — E785 Hyperlipidemia, unspecified: Secondary | ICD-10-CM | POA: Diagnosis not present

## 2022-12-08 DIAGNOSIS — I10 Essential (primary) hypertension: Secondary | ICD-10-CM | POA: Diagnosis not present

## 2022-12-08 DIAGNOSIS — I2510 Atherosclerotic heart disease of native coronary artery without angina pectoris: Secondary | ICD-10-CM | POA: Diagnosis not present

## 2022-12-08 DIAGNOSIS — D649 Anemia, unspecified: Secondary | ICD-10-CM | POA: Diagnosis not present

## 2023-05-26 ENCOUNTER — Ambulatory Visit
Admission: RE | Admit: 2023-05-26 | Discharge: 2023-05-26 | Disposition: A | Discharge status: Home / Self Care | Payer: Medicare HMO | Source: Ambulatory Visit | Attending: Infectious Diseases | Admitting: Infectious Diseases

## 2023-05-26 DIAGNOSIS — Z1231 Encounter for screening mammogram for malignant neoplasm of breast: Secondary | ICD-10-CM | POA: Diagnosis not present

## 2023-05-26 DIAGNOSIS — R0981 Nasal congestion: Secondary | ICD-10-CM | POA: Diagnosis not present

## 2023-05-26 DIAGNOSIS — R197 Diarrhea, unspecified: Secondary | ICD-10-CM | POA: Diagnosis not present

## 2023-05-26 DIAGNOSIS — Z03818 Encounter for observation for suspected exposure to other biological agents ruled out: Secondary | ICD-10-CM | POA: Diagnosis not present

## 2023-05-26 DIAGNOSIS — I10 Essential (primary) hypertension: Secondary | ICD-10-CM | POA: Diagnosis not present

## 2023-05-26 DIAGNOSIS — I2510 Atherosclerotic heart disease of native coronary artery without angina pectoris: Secondary | ICD-10-CM | POA: Diagnosis not present

## 2023-05-26 DIAGNOSIS — R109 Unspecified abdominal pain: Secondary | ICD-10-CM | POA: Diagnosis not present

## 2023-05-26 DIAGNOSIS — B9689 Other specified bacterial agents as the cause of diseases classified elsewhere: Secondary | ICD-10-CM | POA: Diagnosis not present

## 2023-05-26 DIAGNOSIS — J329 Chronic sinusitis, unspecified: Secondary | ICD-10-CM | POA: Diagnosis not present

## 2023-11-08 DIAGNOSIS — Z008 Encounter for other general examination: Secondary | ICD-10-CM | POA: Diagnosis not present

## 2024-10-16 ENCOUNTER — Encounter: Payer: Self-pay | Admitting: *Deleted
